# Patient Record
Sex: Male | Born: 1945 | Race: White | Hispanic: No | Marital: Married | Smoking: Never smoker
Health system: Southern US, Community
[De-identification: ages and names within clinical notes are randomized; demographics above are authoritative.]

## PROBLEM LIST (undated history)

## (undated) DIAGNOSIS — E785 Hyperlipidemia, unspecified: Secondary | ICD-10-CM

## (undated) DIAGNOSIS — K219 Gastro-esophageal reflux disease without esophagitis: Secondary | ICD-10-CM

## (undated) DIAGNOSIS — I10 Essential (primary) hypertension: Secondary | ICD-10-CM

---

## 1999-06-20 ENCOUNTER — Ambulatory Visit (HOSPITAL_COMMUNITY): Admission: RE | Admit: 1999-06-20 | Discharge: 1999-06-20 | Payer: Self-pay | Admitting: *Deleted

## 2000-10-07 ENCOUNTER — Other Ambulatory Visit: Admission: RE | Admit: 2000-10-07 | Discharge: 2000-10-07 | Payer: Self-pay | Admitting: Urology

## 2000-10-07 ENCOUNTER — Encounter (INDEPENDENT_AMBULATORY_CARE_PROVIDER_SITE_OTHER): Payer: Self-pay | Admitting: Specialist

## 2002-05-16 ENCOUNTER — Ambulatory Visit (HOSPITAL_COMMUNITY): Admission: RE | Admit: 2002-05-16 | Discharge: 2002-05-16 | Payer: Self-pay | Admitting: *Deleted

## 2002-05-16 ENCOUNTER — Encounter: Payer: Self-pay | Admitting: *Deleted

## 2002-09-28 ENCOUNTER — Encounter: Payer: Self-pay | Admitting: Internal Medicine

## 2002-09-28 ENCOUNTER — Encounter: Admission: RE | Admit: 2002-09-28 | Discharge: 2002-09-28 | Payer: Self-pay | Admitting: Internal Medicine

## 2008-08-13 ENCOUNTER — Emergency Department (HOSPITAL_BASED_OUTPATIENT_CLINIC_OR_DEPARTMENT_OTHER): Admission: EM | Admit: 2008-08-13 | Discharge: 2008-08-13 | Payer: Self-pay | Admitting: Emergency Medicine

## 2008-08-13 ENCOUNTER — Ambulatory Visit: Payer: Self-pay | Admitting: Interventional Radiology

## 2013-09-28 ENCOUNTER — Emergency Department (HOSPITAL_BASED_OUTPATIENT_CLINIC_OR_DEPARTMENT_OTHER)
Admission: EM | Admit: 2013-09-28 | Discharge: 2013-09-28 | Disposition: A | Discharge status: Home / Self Care | Payer: Medicare Other | Attending: Emergency Medicine | Admitting: Emergency Medicine

## 2013-09-28 ENCOUNTER — Emergency Department (HOSPITAL_BASED_OUTPATIENT_CLINIC_OR_DEPARTMENT_OTHER): Payer: Medicare Other

## 2013-09-28 ENCOUNTER — Encounter (HOSPITAL_BASED_OUTPATIENT_CLINIC_OR_DEPARTMENT_OTHER): Payer: Self-pay | Admitting: Emergency Medicine

## 2013-09-28 DIAGNOSIS — R600 Localized edema: Secondary | ICD-10-CM

## 2013-09-28 DIAGNOSIS — J159 Unspecified bacterial pneumonia: Secondary | ICD-10-CM | POA: Insufficient documentation

## 2013-09-28 DIAGNOSIS — M25519 Pain in unspecified shoulder: Secondary | ICD-10-CM | POA: Insufficient documentation

## 2013-09-28 DIAGNOSIS — K219 Gastro-esophageal reflux disease without esophagitis: Secondary | ICD-10-CM | POA: Insufficient documentation

## 2013-09-28 DIAGNOSIS — E785 Hyperlipidemia, unspecified: Secondary | ICD-10-CM | POA: Insufficient documentation

## 2013-09-28 DIAGNOSIS — J189 Pneumonia, unspecified organism: Secondary | ICD-10-CM

## 2013-09-28 DIAGNOSIS — R609 Edema, unspecified: Secondary | ICD-10-CM | POA: Insufficient documentation

## 2013-09-28 DIAGNOSIS — Z79899 Other long term (current) drug therapy: Secondary | ICD-10-CM | POA: Insufficient documentation

## 2013-09-28 DIAGNOSIS — M542 Cervicalgia: Secondary | ICD-10-CM | POA: Insufficient documentation

## 2013-09-28 DIAGNOSIS — I10 Essential (primary) hypertension: Secondary | ICD-10-CM | POA: Insufficient documentation

## 2013-09-28 HISTORY — DX: Gastro-esophageal reflux disease without esophagitis: K21.9

## 2013-09-28 HISTORY — DX: Hyperlipidemia, unspecified: E78.5

## 2013-09-28 HISTORY — DX: Essential (primary) hypertension: I10

## 2013-09-28 LAB — CBC WITH DIFFERENTIAL/PLATELET
BASOS ABS: 0 10*3/uL (ref 0.0–0.1)
Basophils Relative: 0 % (ref 0–1)
EOS ABS: 0.4 10*3/uL (ref 0.0–0.7)
Eosinophils Relative: 4 % (ref 0–5)
HCT: 41.3 % (ref 39.0–52.0)
Hemoglobin: 14 g/dL (ref 13.0–17.0)
LYMPHS ABS: 1 10*3/uL (ref 0.7–4.0)
Lymphocytes Relative: 9 % — ABNORMAL LOW (ref 12–46)
MCH: 29.4 pg (ref 26.0–34.0)
MCHC: 33.9 g/dL (ref 30.0–36.0)
MCV: 86.8 fL (ref 78.0–100.0)
Monocytes Absolute: 1 10*3/uL (ref 0.1–1.0)
Monocytes Relative: 9 % (ref 3–12)
NEUTROS PCT: 77 % (ref 43–77)
Neutro Abs: 8.4 10*3/uL — ABNORMAL HIGH (ref 1.7–7.7)
PLATELETS: 122 10*3/uL — AB (ref 150–400)
RBC: 4.76 MIL/uL (ref 4.22–5.81)
RDW: 14.3 % (ref 11.5–15.5)
WBC: 10.8 10*3/uL — ABNORMAL HIGH (ref 4.0–10.5)

## 2013-09-28 LAB — COMPREHENSIVE METABOLIC PANEL
ALK PHOS: 94 U/L (ref 39–117)
ALT: 32 U/L (ref 0–53)
AST: 27 U/L (ref 0–37)
Albumin: 4.2 g/dL (ref 3.5–5.2)
BUN: 16 mg/dL (ref 6–23)
CO2: 24 mEq/L (ref 19–32)
Calcium: 9 mg/dL (ref 8.4–10.5)
Chloride: 106 mEq/L (ref 96–112)
Creatinine, Ser: 1 mg/dL (ref 0.50–1.35)
GFR calc Af Amer: 87 mL/min — ABNORMAL LOW (ref >90)
GFR calc non Af Amer: 75 mL/min — ABNORMAL LOW (ref >90)
GLUCOSE: 94 mg/dL (ref 70–99)
POTASSIUM: 3.9 meq/L (ref 3.7–5.3)
SODIUM: 143 meq/L (ref 137–147)
TOTAL PROTEIN: 6.3 g/dL (ref 6.0–8.3)
Total Bilirubin: 0.7 mg/dL (ref 0.3–1.2)

## 2013-09-28 LAB — TROPONIN I: Troponin I: <0.3 ng/mL (ref <0.30)

## 2013-09-28 MED ORDER — RIVAROXABAN 15 MG PO TABS
15.0000 mg | ORAL_TABLET | Freq: Once | ORAL | Status: AC
  Administered 2013-09-28: 15 mg via ORAL
  Filled 2013-09-28: qty 1

## 2013-09-28 MED ORDER — FUROSEMIDE 20 MG PO TABS
20.0000 mg | ORAL_TABLET | Freq: Once | ORAL | Status: AC
Start: 2013-09-28 — End: 2013-09-28
  Administered 2013-09-28: 20 mg via ORAL
  Filled 2013-09-28: qty 1

## 2013-09-28 MED ORDER — RIVAROXABAN 15 MG PO TABS: 15.0000 mg | ORAL_TABLET | Freq: Two times a day (BID) | ORAL | Status: AC

## 2013-09-28 MED ORDER — LEVOFLOXACIN 750 MG PO TABS: 750.0000 mg | ORAL_TABLET | Freq: Every day | ORAL | Status: AC

## 2013-09-28 MED ORDER — LEVOFLOXACIN 750 MG PO TABS
750.0000 mg | ORAL_TABLET | Freq: Once | ORAL | Status: AC
  Administered 2013-09-28: 750 mg via ORAL
  Filled 2013-09-28: qty 1

## 2013-09-28 NOTE — ED Notes (Signed)
MD at bedside. 

## 2013-09-28 NOTE — ED Notes (Signed)
Pt c/o bil lower leg swelling , with SOB and cough x 4 days

## 2013-09-28 NOTE — ED Notes (Signed)
Patient transported to X-ray via stretcher per tech. 

## 2013-09-28 NOTE — Discharge Instructions (Signed)
In the ED, you had a chest x-ray which showed a possible right lower lobe infiltrate. There was no evidence of other excess fluid in your chest (pulmonary edema). This will be treated with a five-day course of Levaquin. He also will have bilateral lower extremity swelling. You were given one dose of Lasix in the ED. It is recommended that you get a lower extremity ultrasound to rule out a blood clot. We will go ahead and treat you with a two-day supply of Xarelto until you can followup with your primary care physician to obtain an ultrasound.   Pneumonia, Adult Pneumonia is an infection of the lungs.  CAUSES Pneumonia may be caused by bacteria or a virus. Usually, these infections are caused by breathing infectious particles into the lungs (respiratory tract). SYMPTOMS   Cough.  Fever.  Chest pain.  Increased rate of breathing.  Wheezing.  Mucus production. DIAGNOSIS  If you have the common symptoms of pneumonia, your caregiver will typically confirm the diagnosis with a chest X-ray. The X-ray will show an abnormality in the lung (pulmonary infiltrate) if you have pneumonia. Other tests of your blood, urine, or sputum may be done to find the specific cause of your pneumonia. Your caregiver may also do tests (blood gases or pulse oximetry) to see how well your lungs are working. TREATMENT  Some forms of pneumonia may be spread to other people when you cough or sneeze. You may be asked to wear a mask before and during your exam. Pneumonia that is caused by bacteria is treated with antibiotic medicine. Pneumonia that is caused by the influenza virus may be treated with an antiviral medicine. Most other viral infections must run their course. These infections will not respond to antibiotics.  PREVENTION A pneumococcal shot (vaccine) is available to prevent a common bacterial cause of pneumonia. This is usually suggested for:  People over 591 years old.  Patients on chemotherapy.  People  with chronic lung problems, such as bronchitis or emphysema.  People with immune system problems. If you are over 65 or have a high risk condition, you may receive the pneumococcal vaccine if you have not received it before. In some countries, a routine influenza vaccine is also recommended. This vaccine can help prevent some cases of pneumonia.You may be offered the influenza vaccine as part of your care. If you smoke, it is time to quit. You may receive instructions on how to stop smoking. Your caregiver can provide medicines and counseling to help you quit. HOME CARE INSTRUCTIONS   Cough suppressants may be used if you are losing too much rest. However, coughing protects you by clearing your lungs. You should avoid using cough suppressants if you can.  Your caregiver may have prescribed medicine if he or she thinks your pneumonia is caused by a bacteria or influenza. Finish your medicine even if you start to feel better.  Your caregiver may also prescribe an expectorant. This loosens the mucus to be coughed up.  Only take over-the-counter or prescription medicines for pain, discomfort, or fever as directed by your caregiver.  Do not smoke. Smoking is a common cause of bronchitis and can contribute to pneumonia. If you are a smoker and continue to smoke, your cough may last several weeks after your pneumonia has cleared.  A cold steam vaporizer or humidifier in your room or home may help loosen mucus.  Coughing is often worse at night. Sleeping in a semi-upright position in a recliner or using a couple pillows  under your head will help with this.  Get rest as you feel it is needed. Your body will usually let you know when you need to rest. SEEK IMMEDIATE MEDICAL CARE IF:   Your illness becomes worse. This is especially true if you are elderly or weakened from any other disease.  You cannot control your cough with suppressants and are losing sleep.  You begin coughing up blood.  You  develop pain which is getting worse or is uncontrolled with medicines.  You have a fever.  Any of the symptoms which initially brought you in for treatment are getting worse rather than better.  You develop shortness of breath or chest pain. MAKE SURE YOU:   Understand these instructions.  Will watch your condition.  Will get help right away if you are not doing well or get worse. Document Released: 05/26/2005 Document Revised: 08/18/2011 Document Reviewed: 08/15/2010 [facility] Patient Information 2014 [place], [place].  Peripheral Edema You have swelling in your legs (peripheral edema). This swelling is due to excess accumulation of salt and water in your body. Edema may be a sign of heart, kidney or liver disease, or a side effect of a medication. It may also be due to problems in the leg veins. Elevating your legs and using special support stockings may be very helpful, if the cause of the swelling is due to poor venous circulation. Avoid long periods of standing, whatever the cause. Treatment of edema depends on identifying the cause. Chips, pretzels, pickles and other salty foods should be avoided. Restricting salt in your diet is almost always needed. Water pills (diuretics) are often used to remove the excess salt and water from your body via urine. These medicines prevent the kidney from reabsorbing sodium. This increases urine flow. Diuretic treatment may also result in lowering of potassium levels in your body. Potassium supplements may be needed if you have to use diuretics daily. Daily weights can help you keep track of your progress in clearing your edema. You should call your caregiver for follow up care as recommended. SEEK IMMEDIATE MEDICAL CARE IF:   You have increased swelling, pain, redness, or heat in your legs.  You develop shortness of breath, especially when lying down.  You develop chest or abdominal pain, weakness, or fainting.  You have a fever. Document  Released: 07/03/2004 Document Revised: 08/18/2011 Document Reviewed: 06/13/2009 [facility]Exit[facility] Patient Information 2014 [place], [place].

## 2013-09-28 NOTE — ED Provider Notes (Signed)
CSN: [id number]     Arrival date & time 09/28/13  1642 History  This chart was scribed for Rolan BuccoMelanie Kenaz Olafson, MD by Ellin MayhewMichael Levi, ED Scribe. This patient was seen in room MH04/MH04 and the patient's care was started at 6:22 PM.  The history is provided by the patient. No language interpreter was used.   HPI Comments: Maxwell Sullivan is a 68 y.o. male who presents to the Emergency Department with a chief complaint of bilateral lower leg swelling with onset 4 days ago. This is a new problem and patient reports that the L side is worse than the R side.  Patient reports that he is visiting [place] and has been on his feet for 12-13 hours/day for 1.5 weeks at the [employer] for his employment. He reports that [place] to [place] by [place] tomorrow morning, where he will be seen by his PCP in 2 days. Patient states that he travels by plane and car frequently for his employment.  He reports that he had mild calf and upper leg pain, bilaterally, last night, which he characterizes as a soreness, that has since resolved. Currently, patient states that the calves feel tight, bilaterally. Patient denies any previous blood clots or cardiovascular issues.  Patient is a non-smoker. Patient reports he has a history of CHF on the paternal side of his family. Patient reports taking spironolactone and an albuterol inhaler due to his frequent travelling.   Patient also states he has had a cough with onset 1 day. Patient reports that he feels SOB only after coughing spells. He denies any CP, SOB with exertion, or any pain while breathing. Patient denies any fever, nausea, or vomiting. He reports having had what he feels to be a food-borne diarrhea 2 days ago that has since resolved.  He has been walking up and down stairs without SOB.  Past Medical History  Diagnosis Date  . Hypertension   . GERD (gastroesophageal reflux disease)   . Hyperlipemia    History reviewed. No pertinent past surgical  history. History reviewed. No pertinent family history. History  Substance Use Topics  . Smoking status: Never Smoker   . Smokeless tobacco: Not on file  . Alcohol Use: No    Review of Systems  Constitutional: Negative for fever, chills, diaphoresis and fatigue.  HENT: Negative for congestion, rhinorrhea and sneezing.   Eyes: Negative.   Respiratory: Positive for cough. Negative for chest tightness, shortness of breath and wheezing.   Cardiovascular: Positive for leg swelling. Negative for chest pain and palpitations.  Gastrointestinal: Negative for nausea, vomiting, abdominal pain, diarrhea and blood in stool.  Genitourinary: Negative for frequency, hematuria, flank pain and difficulty urinating.  Musculoskeletal: Positive for arthralgias (shoulder pain while coughing) and neck pain (while coughing). Negative for back pain and joint swelling.  Skin: Negative for rash.  Neurological: Negative for dizziness, speech difficulty, weakness, numbness and headaches.  All other systems reviewed and are negative.  Allergies  Review of patient's allergies indicates no known allergies.  Home Medications   Prior to Admission medications   Medication Sig Start Date End Date Taking? Authorizing Provider  amLODipine (NORVASC) 10 MG tablet Take 10 mg by mouth daily.   Yes Historical Provider, MD  atorvastatin (LIPITOR) 10 MG tablet Take 10 mg by mouth daily.   Yes Historical Provider, MD  dutasteride (AVODART) 0.5 MG capsule Take 0.5 mg by mouth daily.   Yes Historical Provider, MD  montelukast (SINGULAIR) 10 MG tablet Take 10  mg by mouth at bedtime.   Yes Historical Provider, MD  olmesartan (BENICAR) 20 MG tablet Take 20 mg by mouth daily.   Yes Historical Provider, MD  omeprazole (PRILOSEC) 40 MG capsule Take 40 mg by mouth daily.   Yes Historical Provider, MD  spironolactone (ALDACTONE) 25 MG tablet Take 25 mg by mouth daily.   Yes Historical Provider, MD   Triage Vitals: BP 152/80  Pulse 78   Temp(Src) 98.4 F (36.9 C) (Oral)  Resp 18  Ht 6' (1.829 m)  Wt 200 lb (90.719 kg)  BMI 27.12 kg/m2  SpO2 100%  Physical Exam  Constitutional: He is oriented to person, place, and time. He appears well-developed and well-nourished.  HENT:  Head: Normocephalic and atraumatic.  Eyes: Pupils are equal, round, and reactive to light.  Neck: Normal range of motion. Neck supple.  Cardiovascular: Normal rate, regular rhythm and normal heart sounds.   Pulmonary/Chest: Effort normal. No respiratory distress. He has wheezes (few scattered wheezes, bilaterally). He has no rales. He exhibits no tenderness.  Abdominal: Soft. Bowel sounds are normal. There is no tenderness. There is no rebound and no guarding.  Musculoskeletal: Normal range of motion.       Right lower leg: He exhibits edema (2+ pitting edema). He exhibits no tenderness.       Left lower leg: He exhibits edema (1+ pitting edema). He exhibits no tenderness.  Lymphadenopathy:    He has no cervical adenopathy.  Neurological: He is alert and oriented to person, place, and time.  Skin: Skin is warm and dry. No rash noted.  Psychiatric: He has a normal mood and affect.   ED Course  Procedures (including critical care time)  COORDINATION OF CARE: 5:25 PM-CXR, CBC, and EKG ordered. Will f/u with patient following results.  6:31 PM-Discussed possible pneumonia development seen in the CXR.   Advised patient that the increased frequency of air travel places patient at a higher risk of blood clots. Explained that the [place] needed for blood clot diagnostics is unavailable tonight; however, recommended having a blood clot screening test at another facility, followed by anticoagulant medication, pending the results. Patient explained that he has a flight tomorrow morning and is unable to transfer hospitals. PT is unable to return here tomorrow for ultrasound.  Will prescribe antibiotics and an albuterol inhaler for PNA development. He has an  albuterol inhaler to use for the wheezing.  Given that patient has appointment with his PMD in 2 days, will provide 3 doses of xarelto until he can see his PMD and have u/s on Friday. Gave pt bleeding precautions while on Xarelto.  Pt without symptoms suggestive of PE currently.  No evidence of pulmonary edema.  Advised patient to seek medical attention if any CP or increased SOB develops. Patient agrees with treatment plan.  Labs Review Results for orders placed during the hospital encounter of 09/28/13  CBC WITH DIFFERENTIAL      Result Value Ref Range   WBC 10.8 (*) 4.0 - 10.5 K/uL   RBC 4.76  4.22 - 5.81 MIL/uL   Hemoglobin 14.0  13.0 - 17.0 g/dL   HCT 96.041.3  45.439.0 - 09.852.0 %   MCV 86.8  78.0 - 100.0 fL   MCH 29.4  26.0 - 34.0 pg   MCHC 33.9  30.0 - 36.0 g/dL   RDW 11.914.3  14.711.5 - 82.915.5 %   Platelets 122 (*) 150 - 400 K/uL   Neutrophils Relative % 77  43 - 77 %  Neutro Abs 8.4 (*) 1.7 - 7.7 K/uL   Lymphocytes Relative 9 (*) 12 - 46 %   Lymphs Abs 1.0  0.7 - 4.0 K/uL   Monocytes Relative 9  3 - 12 %   Monocytes Absolute 1.0  0.1 - 1.0 K/uL   Eosinophils Relative 4  0 - 5 %   Eosinophils Absolute 0.4  0.0 - 0.7 K/uL   Basophils Relative 0  0 - 1 %   Basophils Absolute 0.0  0.0 - 0.1 K/uL  COMPREHENSIVE METABOLIC PANEL      Result Value Ref Range   Sodium 143  137 - 147 mEq/L   Potassium 3.9  3.7 - 5.3 mEq/L   Chloride 106  96 - 112 mEq/L   CO2 24  19 - 32 mEq/L   Glucose, Bld 94  70 - 99 mg/dL   BUN 16  6 - 23 mg/dL   Creatinine, Ser 1.61  0.50 - 1.35 mg/dL   Calcium 9.0  8.4 - 09.6 mg/dL   Total Protein 6.3  6.0 - 8.3 g/dL   Albumin 4.2  3.5 - 5.2 g/dL   AST 27  0 - 37 U/L   ALT 32  0 - 53 U/L   Alkaline Phosphatase 94  39 - 117 U/L   Total Bilirubin 0.7  0.3 - 1.2 mg/dL   GFR calc non Af Amer 75 (*) >90 mL/min   GFR calc Af Amer 87 (*) >90 mL/min  TROPONIN I      Result Value Ref Range   Troponin I <0.30  <0.30 ng/mL   Dg Chest 2 View  09/28/2013   CLINICAL DATA:  SOB,  cough SOB, cough  EXAM: CHEST  2 VIEW  COMPARISON:  None.  FINDINGS: The heart size and mediastinal contours are within normal limits. Linear area of increased density within the right middle lobe. The osseous structures are unremarkable.  IMPRESSION: Atelectasis versus infiltrate right middle lobe.   Electronically Signed   By: Salome Holmes M.D.   On: 09/28/2013 18:07    Imaging Review Dg Chest 2 View  09/28/2013   CLINICAL DATA:  SOB, cough SOB, cough  EXAM: CHEST  2 VIEW  COMPARISON:  None.  FINDINGS: The heart size and mediastinal contours are within normal limits. Linear area of increased density within the right middle lobe. The osseous structures are unremarkable.  IMPRESSION: Atelectasis versus infiltrate right middle lobe.   Electronically Signed   By: Salome Holmes M.D.   On: 09/28/2013 18:07    EKG Interpretation   Date/Time:  Wednesday September 28 2013 17:33:37 EDT Ventricular Rate:  75 PR Interval:  158 QRS Duration: 110 QT Interval:  410 QTC Calculation: 457 R Axis:   17 Text Interpretation:  Normal sinus rhythm Incomplete right bundle branch  block Borderline ECG No old tracing to compare Confirmed by Affie Gasner  MD,  Debra Calabretta ([id number]) on 09/28/2013 6:12:19 PM      MDM   Final diagnoses:  Pedal edema  Community acquired pneumonia      I personally performed the services described in this documentation, which was scribed in my presence. The recorded information has been reviewed and is accurate.    Rolan Bucco, MD 09/28/13 [phone]

## 2015-01-30 IMAGING — CR DG CHEST 2V
2 series · 2 of 2 positions shown · non-contrast
Comparison: None.

CLINICAL DATA: SOB, cough SOB, cough

EXAM:
CHEST  2 VIEW

[w chest pa]
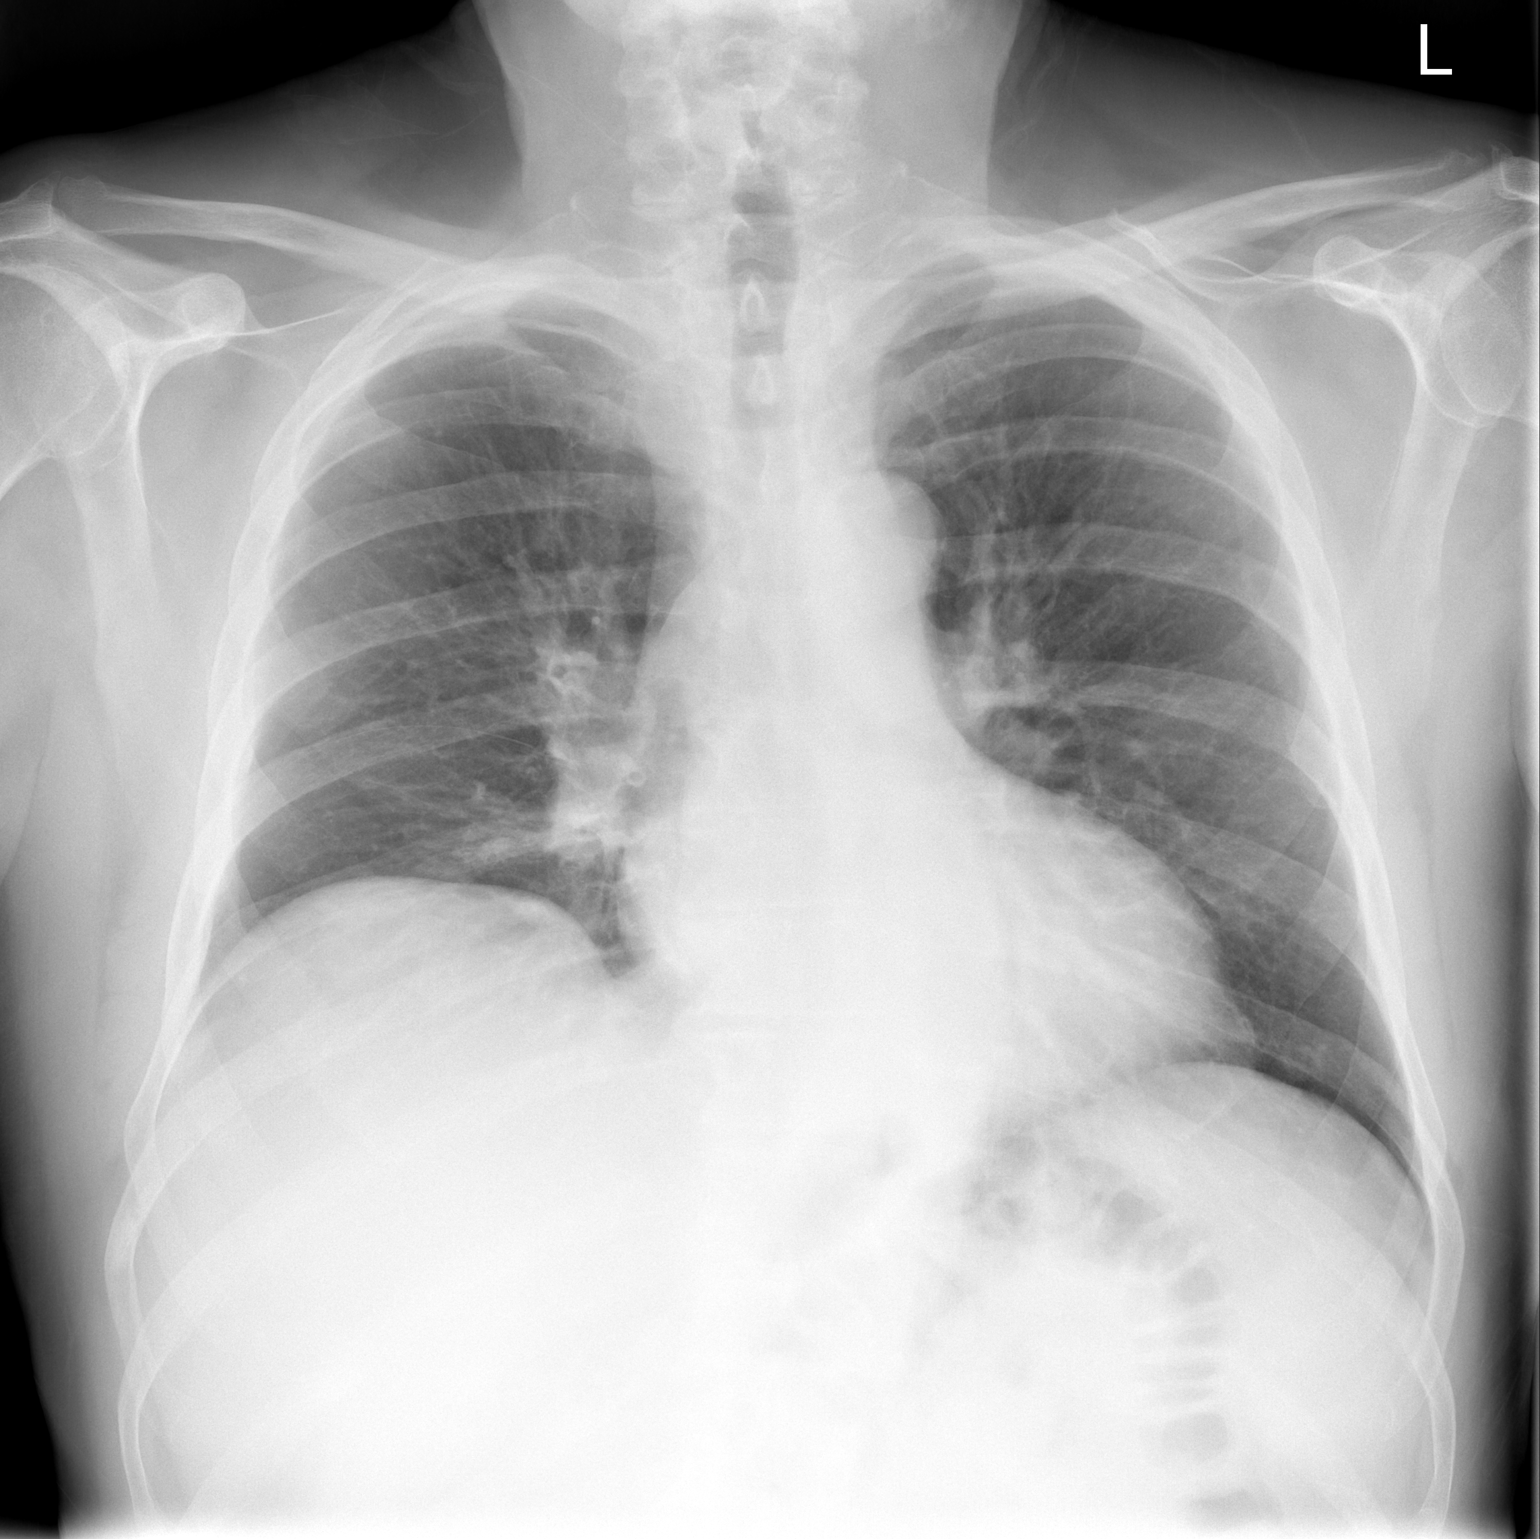

[w chest lat]
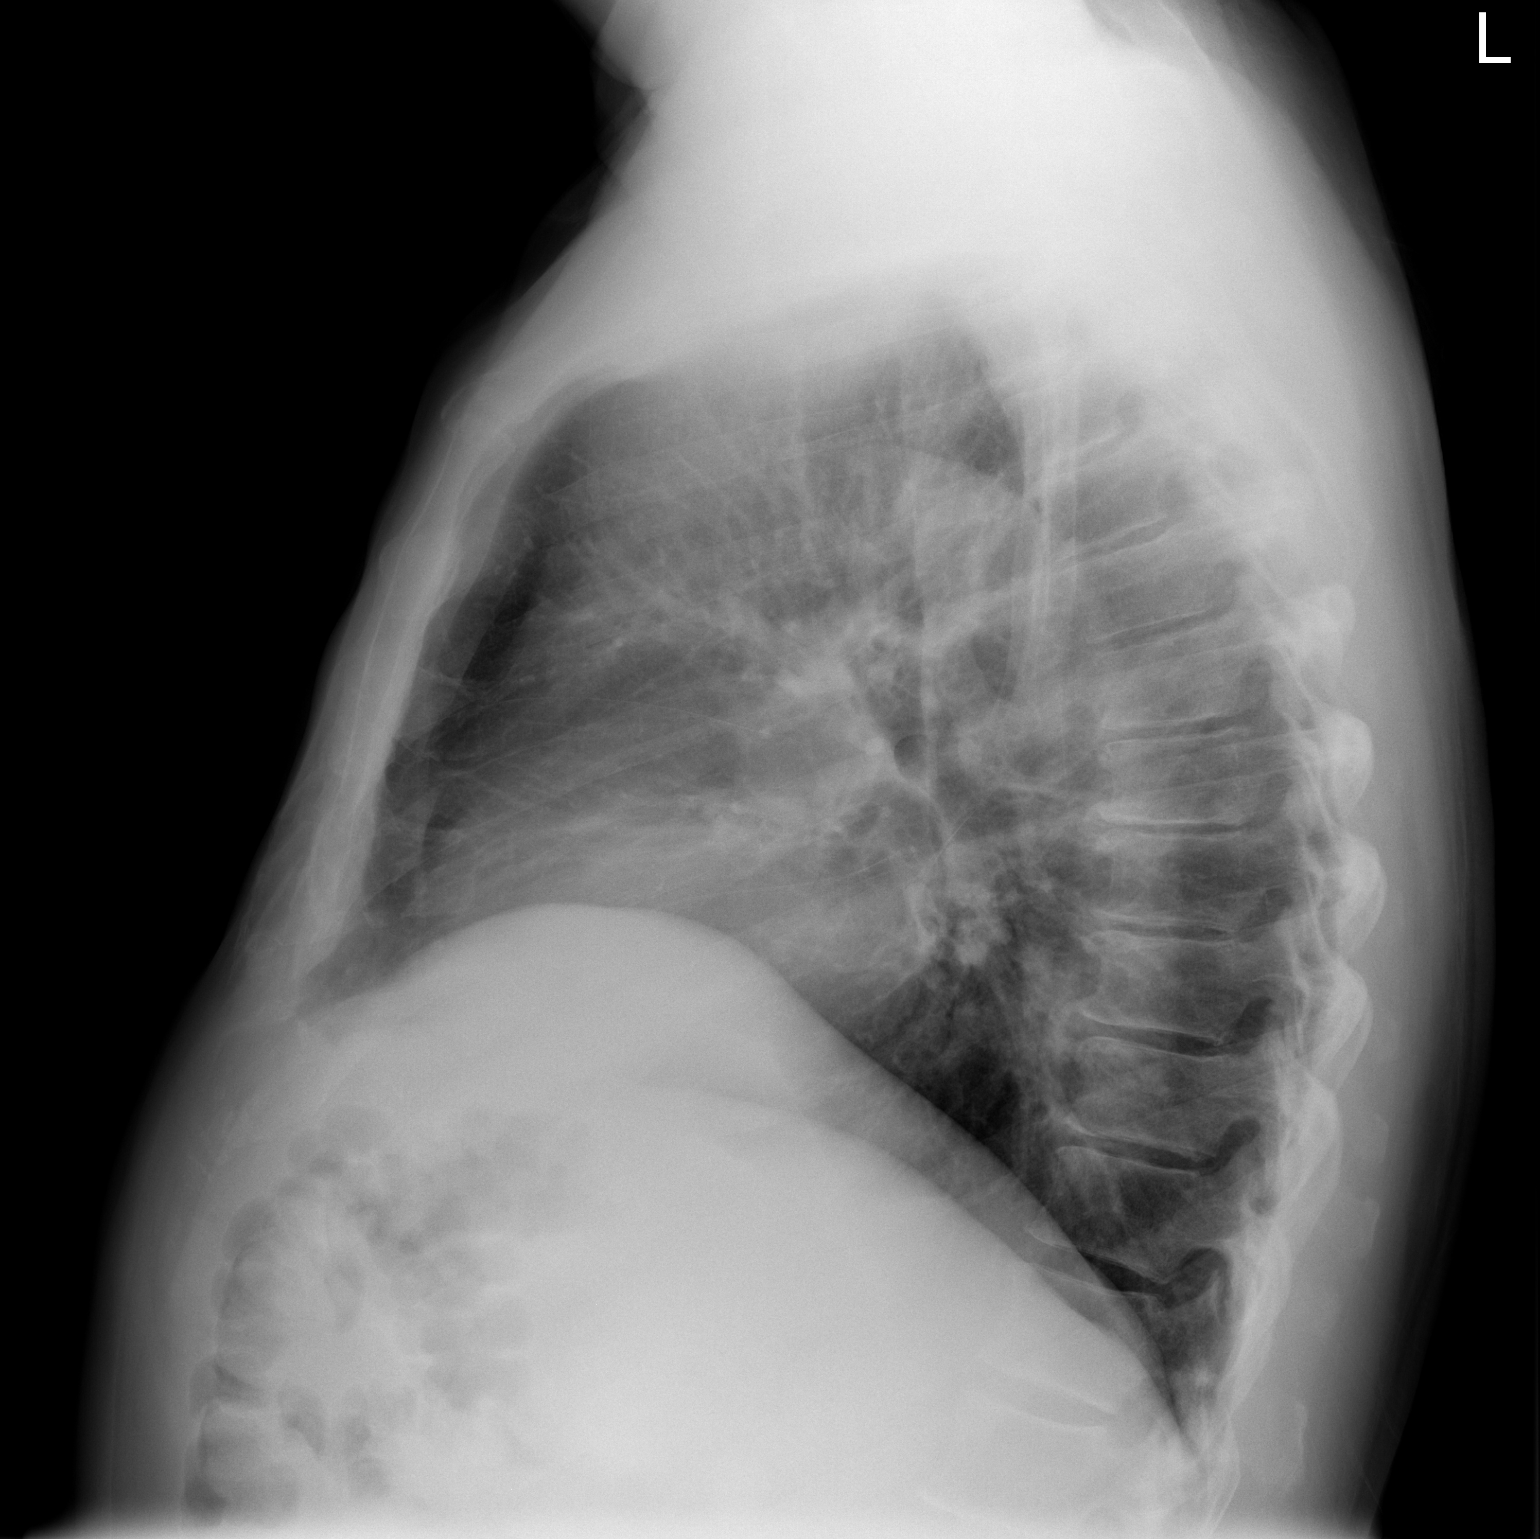

[2 of 2 positions shown; findings below may reference images not displayed]

FINDINGS: The heart size and mediastinal contours are within normal limits.
Linear area of increased density within the right middle lobe. The
osseous structures are unremarkable.
IMPRESSION: Atelectasis versus infiltrate right middle lobe.
